# Patient Record
Sex: Female | Born: 2000 | Race: White | Hispanic: No | Marital: Single | Smoking: Never smoker
Health system: Southern US, Community
[De-identification: ages and names within clinical notes are randomized; demographics above are authoritative.]

## PROBLEM LIST (undated history)

## (undated) DIAGNOSIS — L709 Acne, unspecified: Secondary | ICD-10-CM

## (undated) DIAGNOSIS — J45909 Unspecified asthma, uncomplicated: Secondary | ICD-10-CM

## (undated) HISTORY — DX: Unspecified asthma, uncomplicated: J45.909

## (undated) HISTORY — PX: TONSILLECTOMY: SUR1361

---
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
## 2023-10-25 LAB — PANORAMA PRENATAL TEST FULL PANEL:PANORAMA TEST PLUS 5 ADDITIONAL MICRODELETIONS: FETAL FRACTION: 10.5

## 2023-12-20 ENCOUNTER — Emergency Department (HOSPITAL_COMMUNITY)
Admission: EM | Admit: 2023-12-20 | Discharge: 2023-12-20 | Discharge status: Against Medical Advice | Payer: Medicaid Other | Source: Home / Self Care

## 2024-10-17 ENCOUNTER — Ambulatory Visit: Admitting: Internal Medicine

## 2024-10-17 ENCOUNTER — Encounter: Payer: Self-pay | Admitting: Internal Medicine

## 2024-10-17 VITALS — BP 102/72 | HR 70 | Temp 98.1°F | Ht 65.0 in | Wt 211.1 lb

## 2024-10-17 DIAGNOSIS — J3089 Other allergic rhinitis: Secondary | ICD-10-CM | POA: Diagnosis not present

## 2024-10-17 DIAGNOSIS — L500 Allergic urticaria: Secondary | ICD-10-CM

## 2024-10-17 NOTE — Patient Instructions (Addendum)
 Other Allergic Rhinitis: - Use nasal saline rinses before nose sprays such as with Neilmed Sinus Rinse.  Use distilled water.   - Use Zyrtec 10 mg daily.   Urticaria (Hives): - At this time etiology of hives and swelling is unknown. Hives can be caused by a variety of different triggers including illness/infection, pressure, vibrations, extremes of temperature to name a few however majority of the time there is no identifiable trigger.  -Use Zyrtec 10mg  daily. - If hives are still recurrent or frequent, increase to Zyrtec 10mg  twice daily.  -If no improvement in 2-3 days, add Pepcid 20mg  twice daily and continue Zyrtec 10mg  twice daily.   Hold all anti-histamines (Xyzal, Allegra, Zyrtec, Claritin, Benadryl , Pepcid) 3 days prior to next visit.  Follow up: 12/12 at 1015 for skin testing 1-68 in [place]  [address] [place], [place], [place] [address]

## 2024-10-17 NOTE — Progress Notes (Signed)
 NEW PATIENT  Date of Service/Encounter:  10/17/24  Consult requested by: Practice, [facility]   Subjective:   Janice Crosby (DOB: June 26, 2001) is a 23 y.o. female who presents to the clinic on 10/17/2024 with a chief complaint of Urticaria (Hives that come and go in between in the thighs. ), Rash, and Allergic Rhinitis  .    History obtained from: chart review and patient.   Rhinitis:  Started since childhood.  Symptoms include: nasal congestion, rhinorrhea, post nasal drainage, and sneezing  Occurs year-round with seasonal flares in Spring/Summer  Potential triggers: not sure  Treatments tried:  Zyrtec PRN  Previous allergy testing: no History of sinus surgery: no Nonallergic triggers: none    Rashes: Started 3 years ago. Occurring about multiple times a week but has reduced to few times a month since starting Cetirizine and Pepcid with [facility]. They are itchy, red, raised.  No scarring/pain. They come and go and moved around.   Last about a day or less.   No new medications/products.  Worse during summer with tanning     Reviewed:  04/25/2024: gave birth at 39 weeks, no significant complications.   11/04/2022: seen in ED for allergic reaction with rash and facial swelling. Hives noted on exam.  Given benadryl , pepcid, solumedrol.   04/24/2021: seen in Urgent care for urticaria/angioedema, discussed use of PRN benadryl  and finish medrol dose pak given from ED.   Past Medical History: Past Medical History:  Diagnosis Date   Acne    Asthma    Past Surgical History: Past Surgical History:  Procedure Laterality Date   TONSILLECTOMY      Family History: Family History  Problem Relation Age of Onset   Lupus Father    Eczema Sister    Lupus Paternal Grandmother     Social History:  Flooring in bedroom: engineer, civil (consulting) Pets: none Tobacco use/exposure: smoking 10cigs/day Job: CNA  Medication List:  Allergies as of 10/17/2024        Reactions   Cefzil [cefprozil] Other (See Comments)   hallucination   Morphine And Codeine Other (See Comments)   hallucinations   Penicillins Other (See Comments)   Did it involve swelling of the face/tongue/throat, SOB, or low BP? Unknown Did it involve sudden or severe rash/hives, skin peeling, or any reaction on the inside of your mouth or nose? Unknown Did you need to seek medical attention at a hospital or doctor's office? Unknown When did it last happen?  Childhood-hallucinations If all above answers are "NO", may proceed with cephalosporin use.   Prednisone Other (See Comments)   Hallucinations        Medication List        Accurate as of October 17, 2024  2:14 PM. If you have any questions, ask your nurse or doctor.          STOP taking these medications    Clindamycin-Benzoyl Per (Refr) gel Stopped by: Arleta SHAUNNA Blanch   methocarbamol  500 MG tablet Commonly known as: ROBAXIN  Stopped by: Arleta SHAUNNA Blanch   metroNIDAZOLE 0.75 % vaginal gel Commonly known as: METROGEL Stopped by: Arleta SHAUNNA Blanch       TAKE these medications    cetirizine 10 MG tablet Commonly known as: ZYRTEC Take 10 mg by mouth at bedtime.   Culturelle Caps Take 1 capsule by mouth every morning.   famotidine 20 MG tablet Commonly known as: PEPCID Take 20 mg by mouth 2 (two) times daily as needed.  IRON PO Take 1 tablet by mouth every morning.   norgestimate-ethinyl estradiol 0.25-35 MG-MCG tablet Commonly known as: ORTHO-CYCLEN Take 1 tablet by mouth daily.   triamcinolone cream 0.1 % Commonly known as: KENALOG Apply 1 Application topically 2 (two) times daily.         REVIEW OF SYSTEMS: Pertinent positives and negatives discussed in HPI.   Objective:   Physical Exam: BP 102/72   Pulse 70   Temp 98.1 F (36.7 C)   Ht 5' 5 (1.651 m)   Wt 211 lb 2 oz (95.8 kg)   SpO2 98%   BMI 35.13 kg/m  Body mass index is 35.13 kg/m. GEN: alert, well developed HEENT: clear  conjunctiva, nose with + mild inferior turbinate hypertrophy, pink nasal mucosa, slight clear rhinorrhea, + cobblestoning HEART: regular rate and rhythm, no murmur LUNGS: clear to auscultation bilaterally, no coughing, unlabored respiration ABDOMEN: soft, non distended  SKIN: no rashes or lesions  Assessment:   1. Allergic urticaria   2. Other allergic rhinitis     Plan/Recommendations:  Other Allergic Rhinitis: - Due to turbinate hypertrophy, seasonal flare ups, recurrent hives and unresponsive to over the counter meds, will perform skin testing to identify aeroallergen triggers.   - Use nasal saline rinses before nose sprays such as with Neilmed Sinus Rinse.  Use distilled water.   - Use Zyrtec 10 mg daily.   Urticaria (Hives): - At this time etiology of hives and swelling is unknown. Hives can be caused by a variety of different triggers including illness/infection, pressure, vibrations, extremes of temperature to name a few however majority of the time there is no identifiable trigger.  -Use Zyrtec 10mg  daily. - If hives are still recurrent or frequent, increase to Zyrtec 10mg  twice daily.  -If no improvement in 2-3 days, add Pepcid 20mg  twice daily and continue Zyrtec 10mg  twice daily.   Hold all anti-histamines (Xyzal, Allegra, Zyrtec, Claritin, Benadryl , Pepcid) 3 days prior to next visit.  Follow up: 12/12 at 1015 for skin testing 1-68, no IDs at [facility] office.    Arleta Blanch, MD [facility] 

## 2024-10-28 ENCOUNTER — Ambulatory Visit: Admitting: Internal Medicine

## 2024-10-28 DIAGNOSIS — L500 Allergic urticaria: Secondary | ICD-10-CM

## 2024-10-28 DIAGNOSIS — J301 Allergic rhinitis due to pollen: Secondary | ICD-10-CM | POA: Diagnosis not present

## 2024-10-28 DIAGNOSIS — J3089 Other allergic rhinitis: Secondary | ICD-10-CM

## 2024-10-28 MED ORDER — AZELASTINE HCL 0.1 % NA SOLN: 2.0000 | Freq: Two times a day (BID) | NASAL | 5 refills | Status: AC | PRN

## 2024-10-28 NOTE — Patient Instructions (Addendum)
 Follow up in 4-6 weeks in [place] office  Allergic Rhinitis: - Positive skin test 10/2024: trees, grasses, weeds, molds  - Use nasal saline rinses before nose sprays such as with Neilmed Sinus Rinse.  Use distilled water.   - Use Azelastine 1-2 sprays each nostril twice daily as needed for runny nose, drainage, sneezing, congestion. Aim upward and outward. - Use Zyrtec 10 mg daily.  - Consider allergy shots as long term control of your symptoms by teaching your immune system to be more tolerant of your allergy triggers    Urticaria (Hives): - At this time etiology of hives and swelling is unknown. Hives can be caused by a variety of different triggers including illness/infection, pressure, vibrations, extremes of temperature to name a few however majority of the time there is no identifiable trigger.  -Use Zyrtec 10mg  daily. - If hives are still recurrent or frequent, increase to Zyrtec 10mg  twice daily.  -If no improvement in 2-3 days, add Pepcid 20mg  twice daily and continue Zyrtec 10mg  twice daily.  ALLERGEN AVOIDANCE MEASURES    Molds - Outdoor avoidance Avoid being outside when the grass is being mowed, or the ground is tilled. Avoid playing in leaves, pine straw, hay, etc.  Dead plant materials contain mold. Avoid going into barns or grain storage areas. Remove leaves, clippings and compost from around the home.   Pollen Avoidance Pollen levels are highest during the mid-day and afternoon.  Consider this when planning outdoor activities. Avoid being outside when the grass is being mowed, or wear a mask if the pollen-allergic person must be the one to mow the grass. Keep the windows closed to keep pollen outside of the home. Use an air conditioner to filter the air. Take a shower, wash hair, and change clothing after working or playing outdoors during pollen season.

## 2024-10-28 NOTE — Progress Notes (Signed)
 FOLLOW UP Date of Service/Encounter:  10/28/2024   Subjective:  Janice Crosby (DOB: Dec 31, 2000) is a 23 y.o. female who [facility] for follow up for skin testing.   History obtained from: chart review and patient.  Anti histamines held.   Past Medical History: Past Medical History:  Diagnosis Date   Acne    Asthma     Objective:  There were no vitals taken for this visit. There is no height or weight on file to calculate BMI. Physical Exam: GEN: alert, well developed HEENT: clear conjunctiva, MMM LUNGS: unlabored respiration  Skin Testing:  Skin prick testing was placed, which includes aeroallergens/foods, histamine control, and saline control.  Verbal consent was obtained prior to placing test.  Patient tolerated procedure well.  Allergy testing results were read and interpreted by myself, documented by clinical staff. Adequate positive and negative control.  Positive results to:  Results discussed with patient/family.  Airborne Adult Perc - 10/28/24 1004     Time Antigen Placed 1004    Allergen Manufacturer Jestine    Location Back    Number of Test 55    2. Control-Histamine 3+    3. Bahia 3+    4. Bermuda Negative    5. Johnson 3+    6. [place]  Blue 2+    7. Meadow Fescue Negative    8. Perennial Rye Negative    9. Timothy Negative    10. Ragweed Mix Negative    11. Cocklebur Negative    12. Plantain,  English Negative    13. Baccharis Negative    14. Dog Fennel 2+    15. Russian Thistle Negative    16. Lamb's Quarters Negative    17. Sheep Sorrell Negative    18. Rough Pigweed 2+    19. Marsh Elder, Rough Negative    20. Mugwort, Common Negative    21. Box, Elder 3+    22. Cedar, red 3+    23. Sweet Gum Negative    24. Pecan Pollen Negative    25. Pine Mix Negative    26. Walnut, Black Pollen 3+    27. Red Mulberry Negative    28. Ash Mix Negative    29. Birch Mix Negative    30. Beech American 3+     31. Cottonwood, Eastern Negative    32. Hickory, White Negative    33. Maple Mix Negative    34. Oak, Eastern Mix Negative    35. Sycamore Eastern Negative    36. Alternaria Alternata Negative    37. Cladosporium Herbarum Negative    38. Aspergillus Mix Negative    39. Penicillium Mix 3+    40. Bipolaris Sorokiniana (Helminthosporium) Negative    41. Drechslera Spicifera (Curvularia) 3+    42. Mucor Plumbeus Negative    43. Fusarium Moniliforme Negative    44. Aureobasidium Pullulans (pullulara) Negative    45. Rhizopus Oryzae Negative    46. Botrytis Cinera 3+    47. Epicoccum Nigrum Negative    48. Phoma Betae 2+    49. Dust Mite Mix Negative    50. Cat Hair 10,000 BAU/ml Negative    51.  Dog Epithelia Negative    52. Mixed Feathers Negative    53. Horse Epithelia Negative    54. Cockroach, German Negative    55. Tobacco Leaf Negative          Food Adult Perc - 10/28/24 1000  Time Antigen Placed 1005    Allergen Manufacturer Greer    Location Back    Number of allergen test 13    1. Peanut Negative    2. Soybean Negative    3. Wheat Negative    4. Sesame Negative    5. Milk, Cow Negative    6. Casein Negative    7. Egg White, Chicken Negative    8. Shellfish Mix Negative    9. Fish Mix Negative    10. Cashew Negative    11. Walnut Food Negative    12. Almond Negative    13. Hazelnut Negative           Assessment:   1. Seasonal allergic rhinitis due to pollen   2. Allergic rhinitis caused by mold   3. Allergic urticaria     Plan/Recommendations:  Allergic Rhinitis: - Due to turbinate hypertrophy, seasonal flare ups, recurrent hives and unresponsive to over the counter meds, will perform skin testing to identify aeroallergen triggers.   - Positive skin test 10/2024: trees, grasses, weeds, molds  - Avoidance measures discussed. - Use nasal saline rinses before nose sprays such as with Neilmed Sinus Rinse.  Use distilled water.   - Use Azelastine  1-2 sprays each nostril twice daily as needed for runny nose, drainage, sneezing, congestion. Aim upward and outward. - Use Zyrtec 10 mg daily.  - Consider allergy shots as long term control of your symptoms by teaching your immune system to be more tolerant of your allergy triggers  Urticaria (Hives): - SPT 10/2024: negative to commonly allergenic foods.   - At this time etiology of hives and swelling is unknown. Hives can be caused by a variety of different triggers including illness/infection, pressure, vibrations, extremes of temperature to name a few however majority of the time there is no identifiable trigger.  -Use Zyrtec 10mg  daily. - If hives are still recurrent or frequent, increase to Zyrtec 10mg  twice daily.  -If no improvement in 2-3 days, add Pepcid 20mg  twice daily and continue Zyrtec 10mg  twice daily.  ALLERGEN AVOIDANCE MEASURES    Molds - Outdoor avoidance Avoid being outside when the grass is being mowed, or the ground is tilled. Avoid playing in leaves, pine straw, hay, etc.  Dead plant materials contain mold. Avoid going into barns or grain storage areas. Remove leaves, clippings and compost from around the home.   Pollen Avoidance Pollen levels are highest during the mid-day and afternoon.  Consider this when planning outdoor activities. Avoid being outside when the grass is being mowed, or wear a mask if the pollen-allergic person must be the one to mow the grass. Keep the windows closed to keep pollen outside of the home. Use an air conditioner to filter the air. Take a shower, wash hair, and change clothing after working or playing outdoors during pollen season.      Return in about 6 weeks (around 12/09/2024).  Arleta Blanch, MD [facility] 

## 2024-12-05 ENCOUNTER — Ambulatory Visit: Admitting: Internal Medicine
